# Patient Record
Sex: Male | Born: 2000 | Race: Black or African American | Hispanic: No | Marital: Single | Smoking: Current some day smoker
Health system: Southern US, Community
[De-identification: ages and names within clinical notes are randomized; demographics above are authoritative.]

---
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
## 2019-02-20 ENCOUNTER — Other Ambulatory Visit: Payer: Self-pay

## 2019-02-20 ENCOUNTER — Emergency Department (HOSPITAL_COMMUNITY)
Admission: EM | Admit: 2019-02-20 | Discharge: 2019-02-20 | Disposition: A | Discharge status: Home / Self Care | Payer: BLUE CROSS/BLUE SHIELD | Attending: Emergency Medicine | Admitting: Emergency Medicine

## 2019-02-20 ENCOUNTER — Encounter (HOSPITAL_COMMUNITY): Payer: Self-pay

## 2019-02-20 DIAGNOSIS — Y999 Unspecified external cause status: Secondary | ICD-10-CM | POA: Insufficient documentation

## 2019-02-20 DIAGNOSIS — Z79899 Other long term (current) drug therapy: Secondary | ICD-10-CM | POA: Diagnosis not present

## 2019-02-20 DIAGNOSIS — Y929 Unspecified place or not applicable: Secondary | ICD-10-CM | POA: Diagnosis not present

## 2019-02-20 DIAGNOSIS — F17210 Nicotine dependence, cigarettes, uncomplicated: Secondary | ICD-10-CM | POA: Insufficient documentation

## 2019-02-20 DIAGNOSIS — S0081XA Abrasion of other part of head, initial encounter: Secondary | ICD-10-CM | POA: Insufficient documentation

## 2019-02-20 DIAGNOSIS — Y9389 Activity, other specified: Secondary | ICD-10-CM | POA: Insufficient documentation

## 2019-02-20 MED ORDER — IBUPROFEN 600 MG PO TABS: 600.0000 mg | ORAL_TABLET | Freq: Four times a day (QID) | ORAL | 0 refills | Status: DC | PRN

## 2019-02-20 MED ORDER — METHOCARBAMOL 500 MG PO TABS: 1000.0000 mg | ORAL_TABLET | Freq: Three times a day (TID) | ORAL | 0 refills | Status: DC | PRN
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
## 2019-02-20 NOTE — ED Notes (Signed)
Pt. ambulates well-no hinderence
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
## 2021-03-16 ENCOUNTER — Ambulatory Visit (INDEPENDENT_AMBULATORY_CARE_PROVIDER_SITE_OTHER): Payer: BC Managed Care – PPO | Admitting: Nurse Practitioner

## 2021-03-16 ENCOUNTER — Other Ambulatory Visit: Payer: Self-pay

## 2021-03-16 ENCOUNTER — Encounter: Payer: Self-pay | Admitting: Nurse Practitioner

## 2021-03-16 VITALS — BP 115/69 | HR 46 | Temp 97.4°F | Ht 71.0 in | Wt 181.1 lb

## 2021-03-16 DIAGNOSIS — Z7689 Persons encountering health services in other specified circumstances: Secondary | ICD-10-CM

## 2021-03-16 DIAGNOSIS — R5383 Other fatigue: Secondary | ICD-10-CM

## 2021-03-16 DIAGNOSIS — R071 Chest pain on breathing: Secondary | ICD-10-CM | POA: Diagnosis not present

## 2021-03-16 DIAGNOSIS — Z0000 Encounter for general adult medical examination without abnormal findings: Secondary | ICD-10-CM | POA: Diagnosis not present

## 2021-03-16 DIAGNOSIS — R9431 Abnormal electrocardiogram [ECG] [EKG]: Secondary | ICD-10-CM

## 2021-03-16 DIAGNOSIS — R001 Bradycardia, unspecified: Secondary | ICD-10-CM | POA: Diagnosis not present

## 2021-03-16 NOTE — Progress Notes (Signed)
Reviewed with patient during visit and referral placed to cardio

## 2021-03-16 NOTE — Progress Notes (Signed)
New Patient Office Visit  Subjective:  Patient ID: Caleb Velazquez, male    DOB: Nov 21, 2001  Age: 20 y.o. MRN: [id number]  CC:  Chief Complaint  Patient presents with  . New Patient (Initial Visit)    HPI [name] presents to establish new primary care provider. He states that the last provider he saw was his pediatrician. He states that in February, he had two separate episodes of sharp and stabbing chest pain. Pain was worse with taking a deep breath in. Felt better when he breathed out. He states that episodes lasted for a very short period of time then resolved on their own. He had another, separate incident when he feels like he stood up too quickly. He got very dizzy, had blurred vision, and may have passed out for a minute. He states that once he "came to" he was fine and has not had another episode like that. He denies nausea or vomiting. Denies headaches. He denies having history of cardiovascular problems. He does use e-cigarette routinely. He denies smoking cigarettes. He denies use of illegal or illicit drugs.   History reviewed. No pertinent past medical history.  History reviewed. No pertinent surgical history.  Family History  Problem Relation Age of Onset  . Diabetes Maternal Aunt   . Diabetes Maternal Uncle     Social History   Socioeconomic History  . Marital status: Single    Spouse name: Not on file  . Number of children: Not on file  . Years of education: Not on file  . Highest education level: High school graduate  Occupational History  . Occupation: Pharmacist, hospital: [employer]  Tobacco Use  . Smoking status: Current Some Day Smoker  . Smokeless tobacco: Never Used  . Tobacco comment: e vape  Substance and Sexual Activity  . Alcohol use: Yes  . Drug use: Yes    Types: Other-see comments  . Sexual activity: Yes    Partners: Female    Birth control/protection: Condom  Other Topics Concern  . Not on file  Social History Narrative  . Not on  file   Social Determinants of Health   Financial Resource Strain: Not on file  Food Insecurity: Not on file  Transportation Needs: Not on file  Physical Activity: Not on file  Stress: Not on file  Social Connections: Not on file  Intimate Partner Violence: Not on file    ROS Review of Systems  Constitutional: Negative for activity change, chills and fever.  HENT: Negative for congestion, sinus pressure and sinus pain.   Respiratory: Positive for chest tightness and shortness of breath. Negative for cough and wheezing.        Two episodes of chest tightness and pain which did cause shortness of breath.   Cardiovascular: Positive for chest pain. Negative for palpitations.       Bradycardia  Gastrointestinal: Negative for constipation, diarrhea, nausea and vomiting.  Endocrine: Negative.   Musculoskeletal: Positive for back pain. Negative for myalgias.       States that his back hurts while he is at work. Feels like this is related to lifting boxes at work.   Skin: Negative for rash.  Allergic/Immunologic: Negative for environmental allergies.  Neurological: Positive for dizziness and syncope. Negative for weakness and headaches.  Psychiatric/Behavioral: Negative.  The patient is not nervous/anxious.   All other systems reviewed and are negative.   Objective:   Today's Vitals   03/16/21 0954  BP: 115/69  Pulse: (!) 46  Temp: (!) 97.4 F (36.3 C)  SpO2: 100%  Weight: 181 lb 1.6 oz (82.1 kg)  Height: 5\' 11"  (1.803 m)   Body mass index is 25.26 kg/m.   Physical Exam Vitals and nursing note reviewed.  Constitutional:      Appearance: Normal appearance.  HENT:     Head: Normocephalic and atraumatic.     Nose: Nose normal.  Eyes:     Pupils: Pupils are equal, round, and reactive to light.  Neck:     Thyroid: No thyromegaly.  Cardiovascular:     Rate and Rhythm: Regular rhythm. Bradycardia present.     Heart sounds: Normal heart sounds.     Comments: ECG showing  sinus bradycardia and early repolarization. There also appears to be peaked T waves  Pulmonary:     Effort: Pulmonary effort is normal.     Breath sounds: Normal breath sounds. No wheezing.     Comments: There are congested breath sounds in right lung base. Abdominal:     General: Bowel sounds are normal.     Palpations: Abdomen is soft.     Tenderness: There is no abdominal tenderness.  Musculoskeletal:        General: Normal range of motion.     Cervical back: Normal range of motion and neck supple.  Skin:    General: Skin is warm and dry.  Neurological:     General: No focal deficit present.     Mental Status: He is alert and oriented to person, place, and time.  Psychiatric:        Mood and Affect: Mood normal.        Behavior: Behavior normal.        Thought Content: Thought content normal.        Judgment: Judgment normal.      Assessment & Plan:  1. Encounter to establish care Appointment today to establish new primary care provider   2. Chest pain on breathing ECG showing sinus bradycardia and early repolarization. Evidence of peaked T waves as well. Will get labs in the office. Refer to cardiology for further evaluation. Chest x-ray ordered  - EKG 12-Lead - Magnesium - CK Total (and CKMB) - DG Chest 2 View; Future - Ambulatory referral to Cardiology  3. Bradycardia Check labs in the office. Refer to cardiology for further evaluation.  - TSH - Magnesium - Ambulatory referral to Cardiology  4. Abnormal EKG ECG showing sinus bradycardia and early repolarization. Evidence to suggest peaked T waves. Refer to cardiology for further evaluation.  - Ambulatory referral to Cardiology  5. Other fatigue Check labs while in the office.  - CBC with Differential/Platelet - Comprehensive metabolic panel - TSH - T4, free - Magnesium - Phosphorus  6. Healthcare maintenance Labs ordered and drawn in the office today.  - CBC with Differential/Platelet - Comprehensive  metabolic panel - Lipid panel - T4, free Problem List Items Addressed This Visit      Other   Encounter to establish care - Primary   Chest pain on breathing   Relevant Orders   EKG 12-Lead (Completed)   Magnesium   CK Total (and CKMB)   DG Chest 2 View   Ambulatory referral to Cardiology   Bradycardia   Relevant Orders   TSH   Magnesium   Ambulatory referral to Cardiology   Other fatigue   Relevant Orders   CBC with Differential/Platelet   Comprehensive metabolic panel   TSH   T4, free  Magnesium   Phosphorus   Abnormal EKG   Relevant Orders   Ambulatory referral to Cardiology   Healthcare maintenance   Relevant Orders   CBC with Differential/Platelet   Comprehensive metabolic panel   Lipid panel   T4, free      Outpatient Encounter Medications as of 03/16/2021  Medication Sig  . albuterol (PROVENTIL HFA;VENTOLIN HFA) 108 (90 Base) MCG/ACT inhaler 2-4 puffs with spacer as needed for cough or shortness of breath (Patient not taking: Reported on 03/16/2021)  . ibuprofen (ADVIL,MOTRIN) 600 MG tablet Take 1 tablet (600 mg total) by mouth every 6 (six) hours as needed for moderate pain. (Patient not taking: Reported on 03/16/2021)  . methocarbamol (ROBAXIN) 500 MG tablet Take 2 tablets (1,000 mg total) by mouth every 8 (eight) hours as needed for muscle spasms. (Patient not taking: Reported on 03/16/2021)   No facility-administered encounter medications on file as of 03/16/2021.   Time spent with the patient was approximately 60 minutes. This time included reviewing progress notes, labs, imaging studies, and discussing plan for follow up.   Follow-up: Return in about 4 weeks (around 04/13/2021) for review labs. may be seen sooner if needed .   Carlean Jews, NP

## 2021-03-16 NOTE — Patient Instructions (Signed)
Electronic Cigarette Information  Electronic cigarettes, or e-cigarettes, are battery-operated devices that deliver nicotine--a very addictive drug--to the body. They come in many shapes, including in the shape of a cigarette, pipe, pen, and even a USB memory stick. E-cigarettes have a cartridge that contains a liquid form of nicotine. When a person uses the device, the liquid heats up. It then becomes a vapor. Inhaling this vapor is called vaping. Nicotine is thought to increase your risk for certain types of cancer. In addition to nicotine, e-cigarettes may contain other harmful and cancer-causing chemicals, including:  Formaldehyde.  Acetaldehyde.  Heavy metals.  Ultra-fine particles that can get inhaled deep into the lungs.  Chemical colorings and flavorings. It is not clear how much nicotine you get when vaping, and it is hard to know what chemicals are in the vaping liquids. The health effects of vaping are not completely known, but you should be aware of the possible dangers of using these products. Some people may use e-cigarettes in order to quit smoking tobacco. However, this has not been proven to work, and the Food and Drug Administration (FDA) has not approved e-cigarettes for this purpose. How can using electronic cigarettes affect me?  You may be at risk for developing a dangerous lung disease. There are reports of an increasing number of cases involving serious lung problems, and even death, associated with e-cigarette use. Your risk may be even higher if you: ? Buy e-cigarettes or vaping oils off the street. ? Add any substances to the e-cigarettes that are not intended by the manufacturer.  Vaping may make you crave nicotine. Nicotine does the following: ? Changes your blood sugar levels. ? Increases your heart rate, blood pressure, and breathing rate. ? Increases your risk of developing blood clots (hypercoagulable state) and diabetes. ? Increases your risk of gum disease  that may lead to losing teeth.  If you smoke e-cigarettes, you may be more likely to start smoking or to smoke more tobacco cigarettes.  Becoming addicted to nicotine may make your brain more sensitive to other addictive drugs. You may move to other addictive substances.  You may be in danger of overdosing on nicotine. Nicotine poisoning can cause nausea, vomiting, seizures, and trouble breathing.  An e-cigarette may explode and cause fires and burn injuries.  If you are pregnant, the nicotine in e-cigarettes may be harmful to your baby. Nicotine can cause: ? Brain or lung problems for your baby. ? Your baby to be born too early. ? Your baby to be born with a low birth weight.  Vaping has also been linked to decreases in memory and attention span in children and teens. What actions can I take to stop vaping? If you can, stop vaping on your own before you become addicted to nicotine. If you need help quitting, ask your health care provider. There are three effective ways to fight nicotine addiction:  Nicotine replacement therapy. Using nicotine gum or a nicotine patch blocks your craving for nicotine. Over time, you can reduce the amount of nicotine you use until you can stop using nicotine completely without having cravings.  Prescription medicines approved to fight nicotine addiction. These stop nicotine cravings or block the effects of nicotine.  Behavioral therapy. This may include: ? A self-help smoking cessation program. ? Individual or group therapy. ? A smoking cessation support group. There are several national programs to help you quit smoking or vaping. These include:  Text message programs, such as SmokefreeTXT.  Apps for mobile phones,   including the free quitSTART app.  Hotlines, such as 1-800-QUIT-NOW (1-800-784-8669). Where to find support You can get support at these sites:  [place] Department of Health and Human Services: https://smokefree.gov  American Lung  Association: www.lung.org Where to find more information Learn more about e-cigarettes from:  National Institute on Drug Abuse: www.drugabuse.gov  Centers for Disease Control and Prevention: www.cdc.gov Summary  E-cigarettes can cause nicotine addiction.  E-cigarettes are not approved as a way to stop smoking. They are not a risk-free alternative to smoking tobacco.  There are reports of an increasing number of cases involving serious lung problems, and even death, associated with e-cigarette use.  If you can stop vaping on your own, do it before you become addicted to nicotine. If you need help quitting, ask your health care provider.  There are various methods and programs that can help you stop smoking or vaping. This information is not intended to replace advice given to you by your health care provider. Make sure you discuss any questions you have with your health care provider. Document Revised: 03/31/2019 Document Reviewed: 02/13/2019 [employer] Patient Education  2021 [employer]  

## 2021-03-17 LAB — CBC WITH DIFFERENTIAL/PLATELET
Basophils Absolute: 0 10*3/uL (ref 0.0–0.2)
Basos: 1 %
EOS (ABSOLUTE): 0.1 10*3/uL (ref 0.0–0.4)
Eos: 3 %
Hematocrit: 42.9 % (ref 37.5–51.0)
Hemoglobin: 13.9 g/dL (ref 13.0–17.7)
Immature Grans (Abs): 0 10*3/uL (ref 0.0–0.1)
Immature Granulocytes: 0 %
Lymphocytes Absolute: 1.7 10*3/uL (ref 0.7–3.1)
Lymphs: 60 %
MCH: 29.3 pg (ref 26.6–33.0)
MCHC: 32.4 g/dL (ref 31.5–35.7)
MCV: 90 fL (ref 79–97)
Monocytes Absolute: 0.3 10*3/uL (ref 0.1–0.9)
Monocytes: 10 %
Neutrophils Absolute: 0.7 10*3/uL — ABNORMAL LOW (ref 1.4–7.0)
Neutrophils: 26 %
Platelets: 291 10*3/uL (ref 150–450)
RBC: 4.75 x10E6/uL (ref 4.14–5.80)
RDW: 12.3 % (ref 11.6–15.4)
WBC: 2.9 10*3/uL — ABNORMAL LOW (ref 3.4–10.8)

## 2021-03-17 LAB — COMPREHENSIVE METABOLIC PANEL
ALT: 19 IU/L (ref 0–44)
AST: 19 IU/L (ref 0–40)
Albumin/Globulin Ratio: 1.9 (ref 1.2–2.2)
Albumin: 4.8 g/dL (ref 4.1–5.2)
Alkaline Phosphatase: 38 IU/L — ABNORMAL LOW (ref 44–121)
BUN/Creatinine Ratio: 9 (ref 9–20)
BUN: 9 mg/dL (ref 6–20)
Bilirubin Total: 1.1 mg/dL (ref 0.0–1.2)
CO2: 21 mmol/L (ref 20–29)
Calcium: 10 mg/dL (ref 8.7–10.2)
Chloride: 102 mmol/L (ref 96–106)
Creatinine, Ser: 1.03 mg/dL (ref 0.76–1.27)
Globulin, Total: 2.5 g/dL (ref 1.5–4.5)
Glucose: 79 mg/dL (ref 65–99)
Potassium: 4.3 mmol/L (ref 3.5–5.2)
Sodium: 140 mmol/L (ref 134–144)
Total Protein: 7.3 g/dL (ref 6.0–8.5)
eGFR: 106 mL/min/{1.73_m2} (ref >59)

## 2021-03-17 LAB — T4, FREE: Free T4: 1.06 ng/dL (ref 0.82–1.77)

## 2021-03-17 LAB — LIPID PANEL
Chol/HDL Ratio: 2.8 ratio (ref 0.0–5.0)
Cholesterol, Total: 124 mg/dL (ref 100–199)
HDL: 44 mg/dL (ref >39)
LDL Chol Calc (NIH): 68 mg/dL (ref 0–99)
Triglycerides: 50 mg/dL (ref 0–149)
VLDL Cholesterol Cal: 12 mg/dL (ref 5–40)

## 2021-03-17 LAB — PHOSPHORUS: Phosphorus: 4.1 mg/dL (ref 2.8–4.1)

## 2021-03-17 LAB — TSH: TSH: 1.4 u[IU]/mL (ref 0.450–4.500)

## 2021-03-17 LAB — CK TOTAL AND CKMB (NOT AT ARMC)
CK-MB Index: 1.4 ng/mL (ref 0.0–10.4)
Total CK: 243 U/L (ref 49–439)

## 2021-03-17 LAB — MAGNESIUM: Magnesium: 2 mg/dL (ref 1.6–2.3)

## 2021-03-17 NOTE — Progress Notes (Signed)
Low WBC count. Will redraw

## 2021-04-03 ENCOUNTER — Ambulatory Visit: Payer: BC Managed Care – PPO | Admitting: Nurse Practitioner

## 2021-04-12 ENCOUNTER — Ambulatory Visit: Payer: BC Managed Care – PPO | Admitting: Nurse Practitioner
